# Patient Record
Sex: Male | Born: 1965 | Marital: Married
Health system: Southern US, Community
[De-identification: ages and names within clinical notes are randomized; demographics above are authoritative.]

---

## 2019-01-31 ENCOUNTER — Other Ambulatory Visit: Payer: Self-pay | Admitting: Orthopedic Surgery

## 2019-01-31 ENCOUNTER — Telehealth: Payer: Self-pay | Admitting: Nurse Practitioner

## 2019-01-31 DIAGNOSIS — M5416 Radiculopathy, lumbar region: Secondary | ICD-10-CM

## 2019-01-31 NOTE — Telephone Encounter (Signed)
Phone call to patient to verify medication list and allergies for myelogram procedure. Pt aware he will not need to hold any medications for this procedure. Pre and post procedure instructions reviewed with pt. 

## 2019-02-09 ENCOUNTER — Ambulatory Visit
Admission: RE | Admit: 2019-02-09 | Discharge: 2019-02-09 | Disposition: A | Discharge status: Home / Self Care | Payer: Worker's Compensation | Source: Ambulatory Visit | Attending: Orthopedic Surgery | Admitting: Orthopedic Surgery

## 2019-02-09 ENCOUNTER — Ambulatory Visit
Admission: RE | Admit: 2019-02-09 | Discharge: 2019-02-09 | Disposition: A | Discharge status: Home / Self Care | Payer: Self-pay | Source: Ambulatory Visit | Attending: Orthopedic Surgery | Admitting: Orthopedic Surgery

## 2019-02-09 DIAGNOSIS — M5416 Radiculopathy, lumbar region: Secondary | ICD-10-CM

## 2019-02-09 MED ORDER — DIAZEPAM 5 MG PO TABS
10.0000 mg | ORAL_TABLET | Freq: Once | ORAL | Status: AC
  Administered 2019-02-09: 13:00:00 10 mg via ORAL

## 2019-02-09 MED ORDER — IOPAMIDOL (ISOVUE-M 200) INJECTION 41%
15.0000 mL | Freq: Once | INTRAMUSCULAR | Status: AC
  Administered 2019-02-09: 15 mL via INTRATHECAL

## 2019-02-09 NOTE — Discharge Instructions (Signed)

## 2020-07-09 IMAGING — CT CT L SPINE W/ CM
1 of 7 series · 6 of 14 positions shown, 8 images · non-contrast
Comparison: None

CLINICAL DATA: Lumbar radiculopathy. Numbness and paresthesias into
the lower extremities bilaterally.
TECHNIQUE: Contiguous axial images were obtained through the Lumbar spine after
the intrathecal infusion of infusion. Coronal and sagittal
reconstructions were obtained of the axial image sets.

[Series 3: l spine soft · axial · 0.33mm/px · z∈[-306,-98]mm · 6 of 97 slices shown, 8 images]
[im 14/97  soft-tissue]
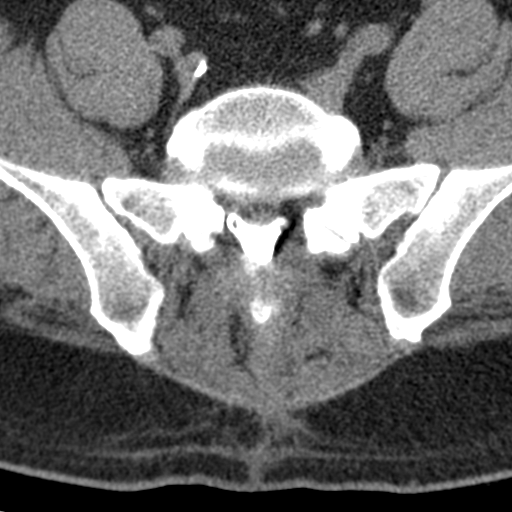
[im 14/97  bone]
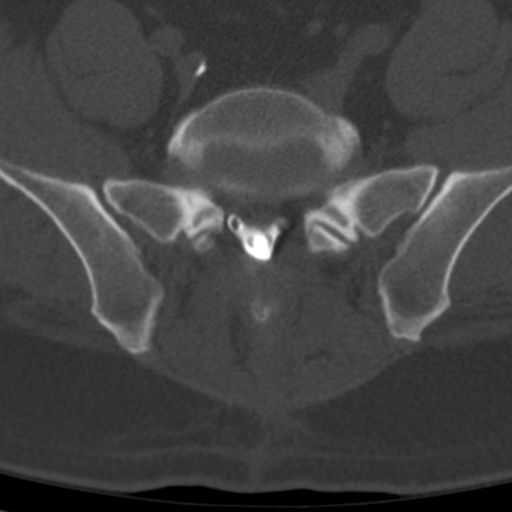
[im 28/97  bone]
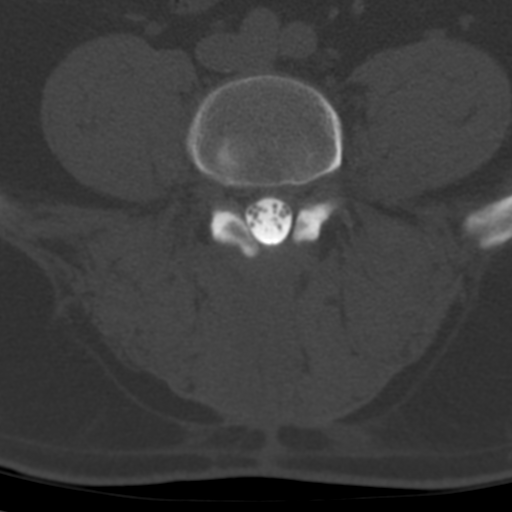
[im 42/97  bone]
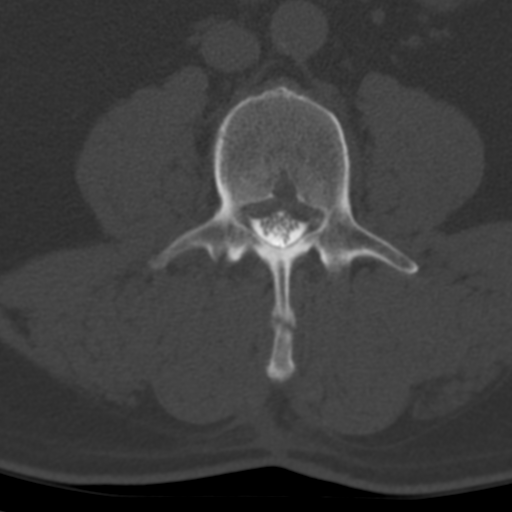
[im 55/97  bone]
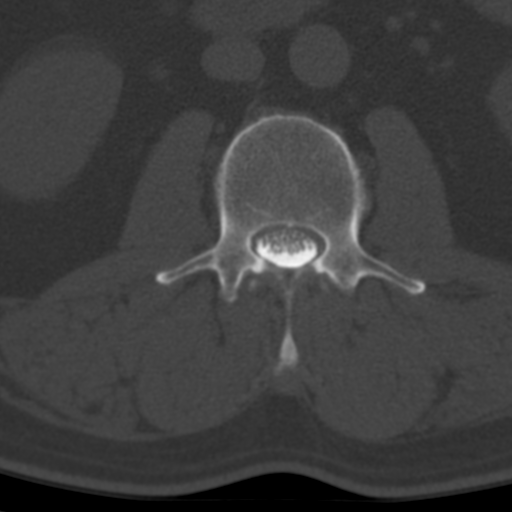
[im 69/97  soft-tissue]
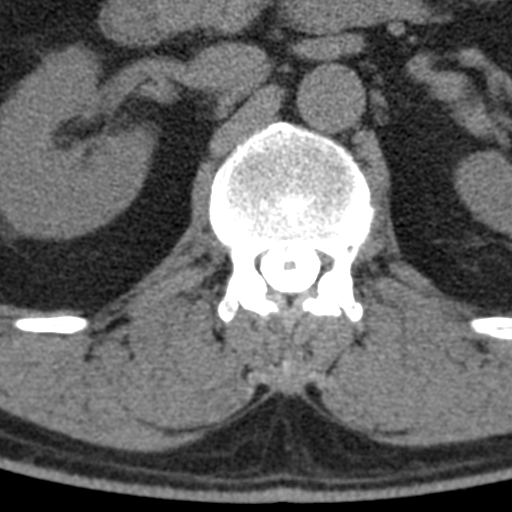
[im 69/97  bone]
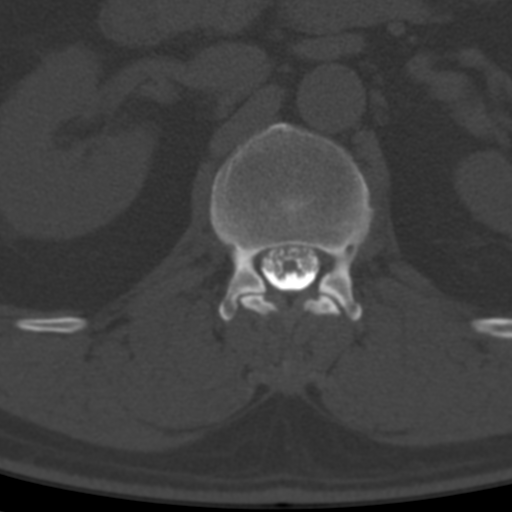
[im 83/97  bone]
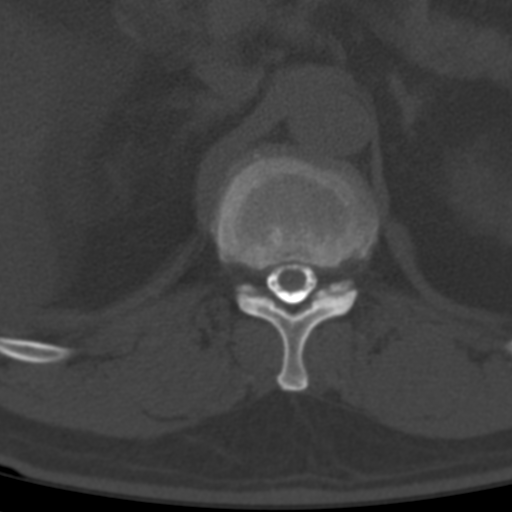

[6 of 14 positions shown; findings below may reference images not displayed]

EXAM:
LUMBAR MYELOGRAM

FLUOROSCOPY TIME:  Radiation Exposure Index (as provided by the
fluoroscopic device): 295.79 uGy*m2

PROCEDURE:
After thorough discussion of risks and benefits of the procedure
including bleeding, infection, injury to nerves, blood vessels,
adjacent structures as well as headache and CSF leak, written and
oral informed consent was obtained. Consent was obtained by Dr.
Yllnor Musteata. Time out form was completed.

Patient was positioned prone on the fluoroscopy table. Local
anesthesia was provided with 1% lidocaine without epinephrine after
prepped and draped in the usual sterile fashion. Puncture was
performed at L2-3 using a 3 1/2 inch 22-gauge spinal needle via left
paramedian approach. Using a single pass through the dura, the
needle was placed within the thecal sac, with return of clear CSF.
15 mL of Isovue N-QKK was injected into the thecal sac, with normal
opacification of the nerve roots and cauda equina consistent with
free flow within the subarachnoid space.

I personally performed the lumbar puncture and administered the
intrathecal contrast. I also personally supervised acquisition of
the myelogram images.
FINDINGS: LUMBAR MYELOGRAM FINDINGS:

Five non rib-bearing lumbar type vertebral bodies are present.
Slight retrolisthesis L2-3 is worse with standing. This is partially
reduced in flexion exaggerated in extension. Slight retrolisthesis
at L3-4 does not change significantly with standing, flexion or
extension.

Broad-based disc protrusion at L2-3 is also worse with standing and
extension. Mild disc bulging at L3-4 is slightly worse with
standing.

There is no significant stenosis at L4-5 or L5-S1. The L1-2 disc
level is within limits.

CT LUMBAR MYELOGRAM FINDINGS:

The lumbar spine is imaged from T10-11 through the midbody of S2.
There is straightening of the normal cervical lordosis. Slight
retrolisthesis at L2-3 and L3-4 is similar to the plain film
radiographs.

Remote L3 spinous process fracture is evident.

The concert medullaris terminates at L1, within normal limits.

Scattered calcifications are present in the aorta without aneurysm.

T12-L1: Negative.

L1-2: Negative.

L2-3: A broad-based disc protrusion is present. There is partial
effacement of the ventral CSF. The central canal is patent. Mild
foraminal narrowing is worse left than right.

L3-4: A broad-based disc protrusion is present. The central canal is
patent. Facet hypertrophy and ligamentum flavum thickening
contribute to mild to moderate foraminal narrowing bilaterally.

L4-5: Wide laminectomy is noted. Central canal is decompressed.
Lateral disc protrusions and facet hypertrophy contribute to
moderate bilateral foraminal narrowing.

L5-S1: Wide laminectomy is noted. Mild facet hypertrophy is present
bilaterally. Disc protrusions and facet spurring contribute to mild
bilateral foraminal narrowing, right greater than left.
IMPRESSION: 1. Laminectomies at L4-5 and L5-S1 with decompression of the central
canal.
2. Foraminal stenosis is greatest at L4-5 where residual lateral
disc protrusions and facet hypertrophy contribute to moderate
bilateral stenosis.
3. Mild foraminal narrowing bilaterally at L5-S1 is worse on right.
4. Mild to moderate foraminal narrowing bilaterally at L3-4 without
significant central canal stenosis.
5. Mild foraminal narrowing at L2-3 is worse on left.
6. Scattered atherosclerotic changes within the aorta without
aneurysm focal stenosis. Aortic Atherosclerosis ([id number]).
